# Patient Record
Sex: Male | Born: 1990 | Race: White | Hispanic: No | Marital: Single | Smoking: Current every day smoker
Health system: Southern US, Community
[De-identification: ages and names within clinical notes are randomized; demographics above are authoritative.]

---

## 2015-01-03 ENCOUNTER — Other Ambulatory Visit: Payer: Self-pay | Admitting: Urology

## 2015-01-03 DIAGNOSIS — R310 Gross hematuria: Secondary | ICD-10-CM

## 2015-01-05 ENCOUNTER — Ambulatory Visit
Admission: RE | Admit: 2015-01-05 | Discharge: 2015-01-05 | Disposition: A | Discharge status: Home / Self Care | Payer: No Typology Code available for payment source | Source: Ambulatory Visit | Attending: Urology | Admitting: Urology

## 2015-01-05 DIAGNOSIS — R310 Gross hematuria: Secondary | ICD-10-CM

## 2016-03-03 IMAGING — US US RENAL
1 series · 14 of 25 positions shown · non-contrast
Comparison: None.

CLINICAL DATA: Gross hematuria.

EXAM:
RENAL/URINARY TRACT ULTRASOUND COMPLETE

[Series 1: us renal · 0.24mm/px · 14 of 44 slices shown]
[im 1/44]
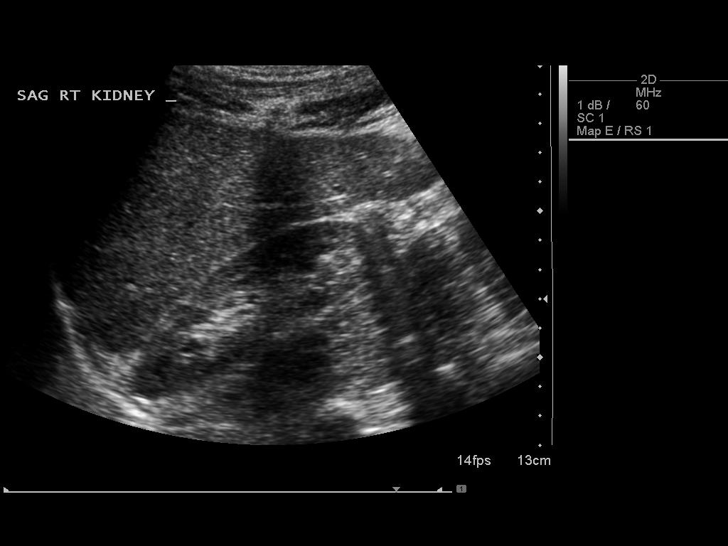
[im 4/44]
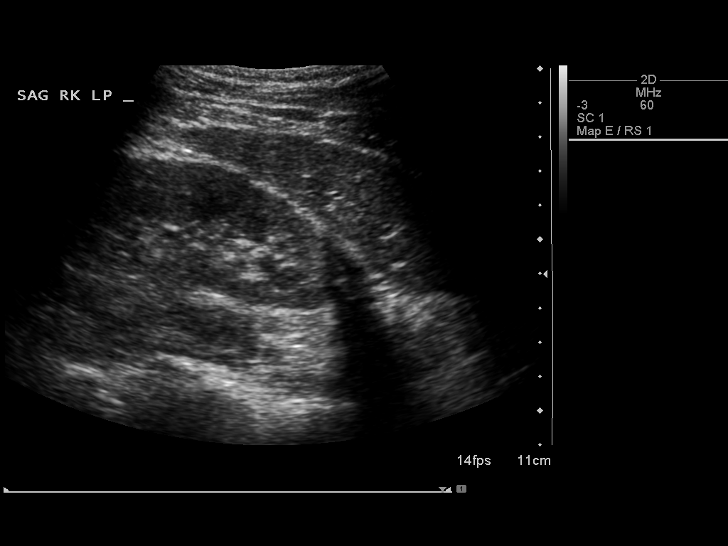
[im 8/44]
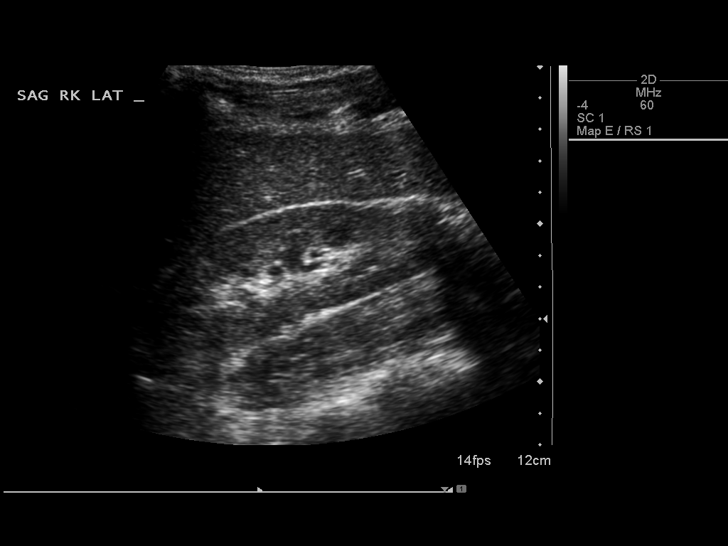
[im 11/44]
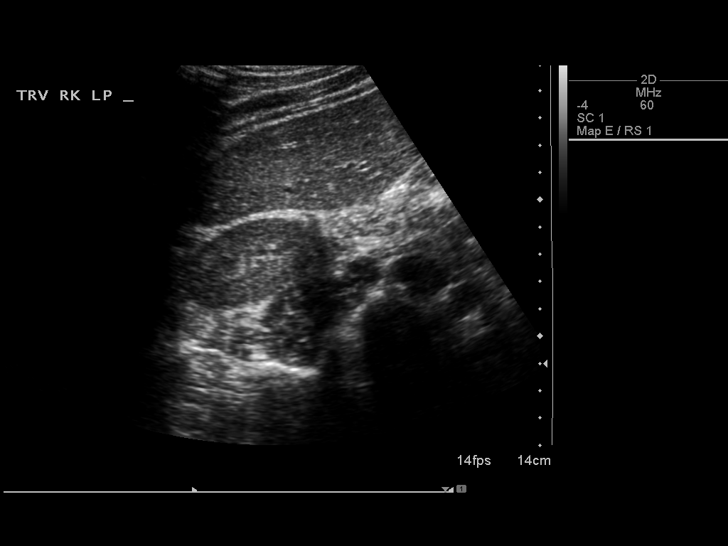
[im 15/44]
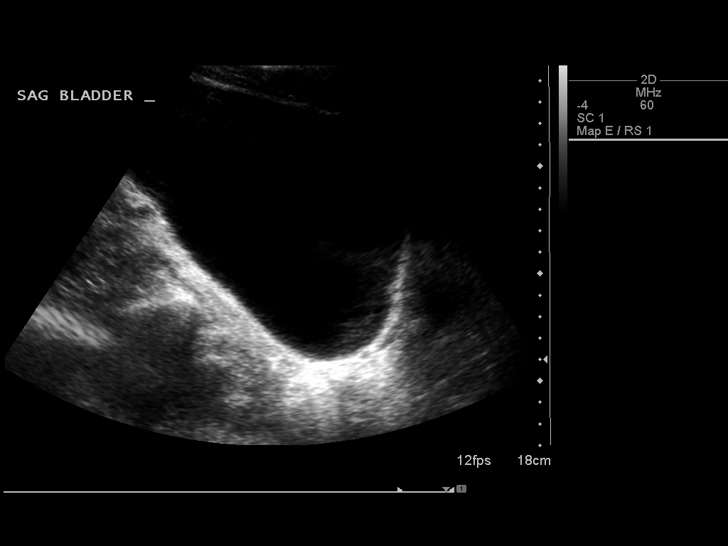
[im 17/44]
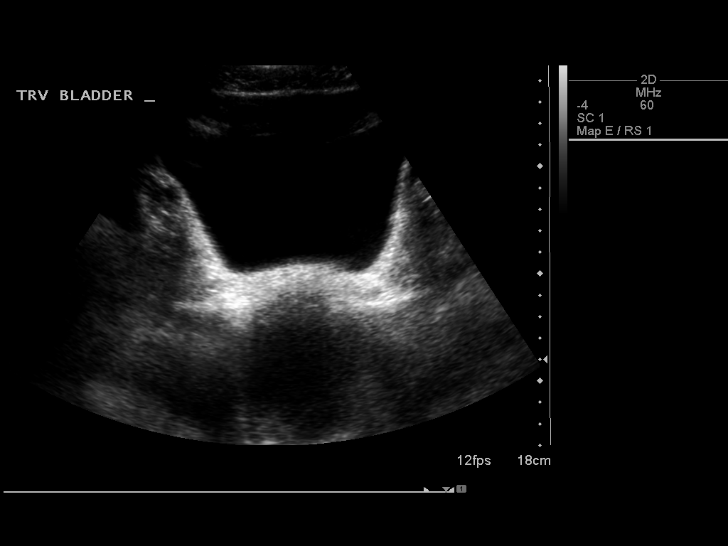
[im 20/44]
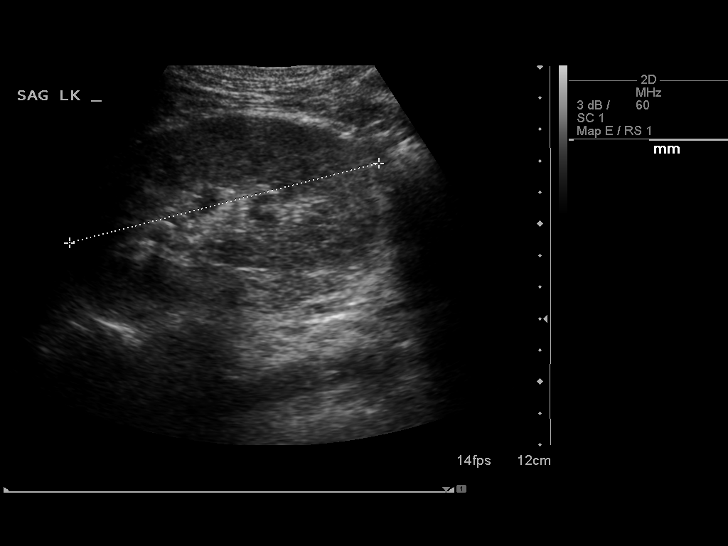
[im 24/44]
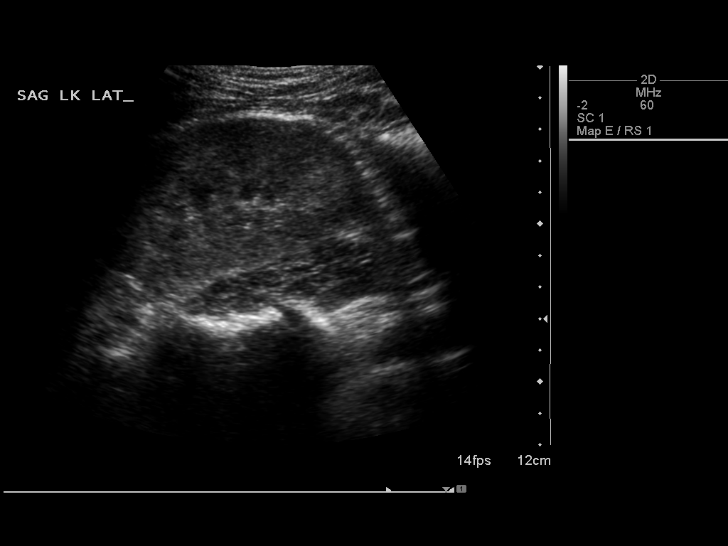
[im 27/44]
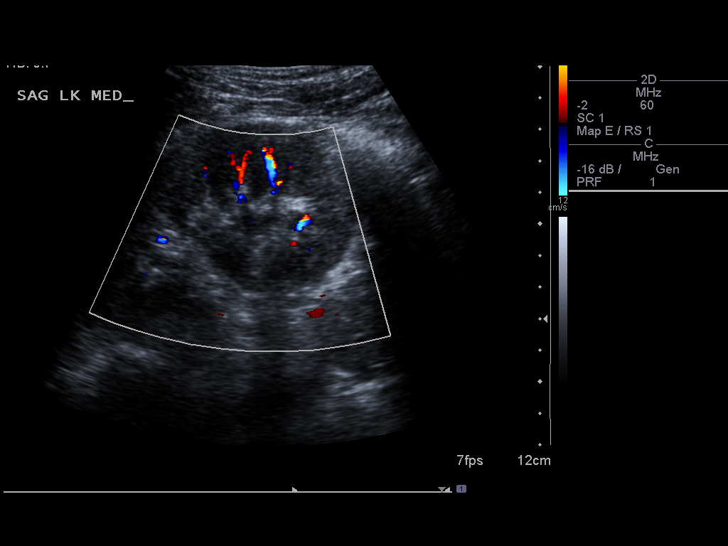
[im 29/44]
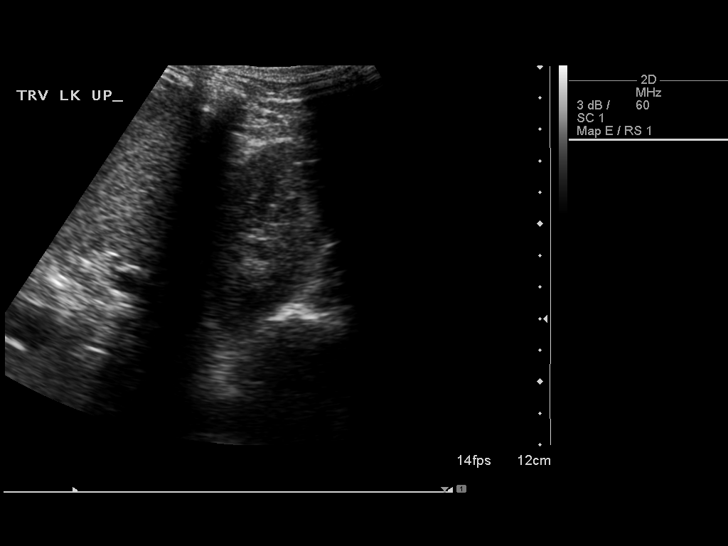
[im 33/44]
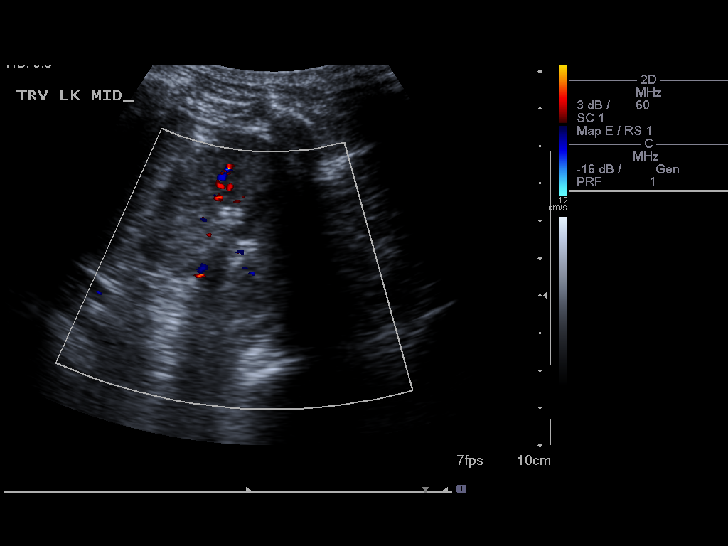
[im 36/44]
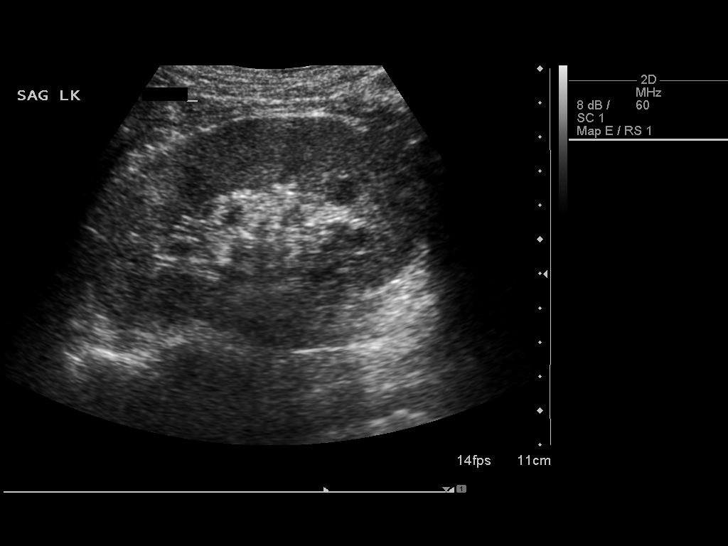
[im 40/44]
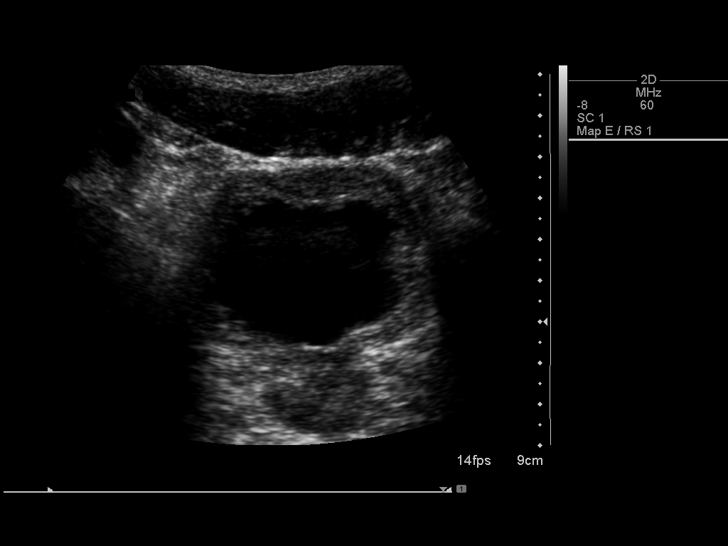
[im 44/44]
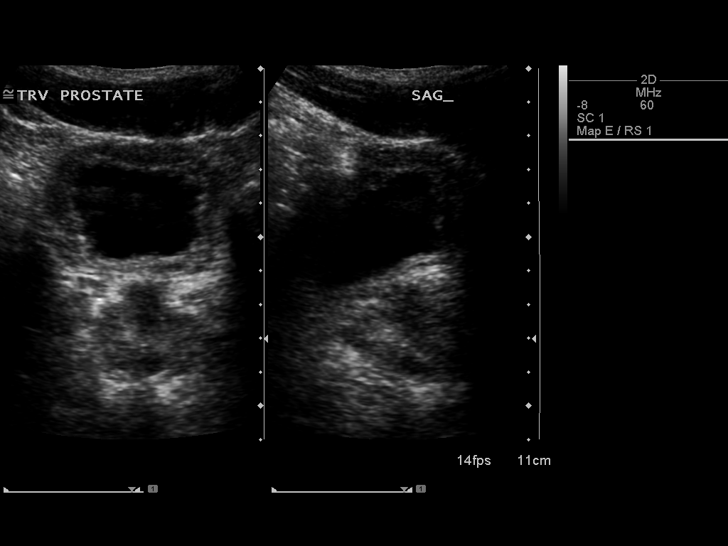

[14 of 25 positions shown; findings below may reference images not displayed]

FINDINGS: Right Kidney:

Length: 10.7 cm. Echogenicity within normal limits. No mass or
hydronephrosis visualized.

Left Kidney:

Length: 10.1 cm. Echogenicity within normal limits. No mass or
hydronephrosis visualized.

Bladder:

Appears normal for degree of bladder distention.
IMPRESSION: Negative.  No evidence of mass or hydronephrosis.

## 2022-01-14 ENCOUNTER — Emergency Department (HOSPITAL_COMMUNITY): Payer: Self-pay

## 2022-01-14 ENCOUNTER — Emergency Department (HOSPITAL_COMMUNITY)
Admission: EM | Admit: 2022-01-14 | Discharge: 2022-01-15 | Disposition: A | Discharge status: Other Acute Inpatient Hospital | Payer: Self-pay | Attending: Emergency Medicine | Admitting: Emergency Medicine

## 2022-01-14 ENCOUNTER — Encounter (HOSPITAL_COMMUNITY): Payer: Self-pay | Admitting: Emergency Medicine

## 2022-01-14 DIAGNOSIS — H7091 Unspecified mastoiditis, right ear: Secondary | ICD-10-CM

## 2022-01-14 DIAGNOSIS — Z20822 Contact with and (suspected) exposure to covid-19: Secondary | ICD-10-CM | POA: Insufficient documentation

## 2022-01-14 DIAGNOSIS — G08 Intracranial and intraspinal phlebitis and thrombophlebitis: Secondary | ICD-10-CM | POA: Insufficient documentation

## 2022-01-14 DIAGNOSIS — R519 Headache, unspecified: Secondary | ICD-10-CM | POA: Insufficient documentation

## 2022-01-14 DIAGNOSIS — H70003 Acute mastoiditis without complications, bilateral: Secondary | ICD-10-CM | POA: Insufficient documentation

## 2022-01-14 LAB — CBC WITH DIFFERENTIAL/PLATELET
Abs Immature Granulocytes: 0.19 10*3/uL — ABNORMAL HIGH (ref 0.00–0.07)
Basophils Absolute: 0.1 10*3/uL (ref 0.0–0.1)
Basophils Relative: 0 %
Eosinophils Absolute: 0 10*3/uL (ref 0.0–0.5)
Eosinophils Relative: 0 %
HCT: 41.1 % (ref 39.0–52.0)
Hemoglobin: 13.8 g/dL (ref 13.0–17.0)
Immature Granulocytes: 1 %
Lymphocytes Relative: 6 %
Lymphs Abs: 1.4 10*3/uL (ref 0.7–4.0)
MCH: 29.5 pg (ref 26.0–34.0)
MCHC: 33.6 g/dL (ref 30.0–36.0)
MCV: 87.8 fL (ref 80.0–100.0)
Monocytes Absolute: 1.7 10*3/uL — ABNORMAL HIGH (ref 0.1–1.0)
Monocytes Relative: 7 %
Neutro Abs: 21.4 10*3/uL — ABNORMAL HIGH (ref 1.7–7.7)
Neutrophils Relative %: 86 %
Platelets: 355 10*3/uL (ref 150–400)
RBC: 4.68 MIL/uL (ref 4.22–5.81)
RDW: 12.9 % (ref 11.5–15.5)
WBC: 24.9 10*3/uL — ABNORMAL HIGH (ref 4.0–10.5)
nRBC: 0 % (ref 0.0–0.2)

## 2022-01-14 LAB — COMPREHENSIVE METABOLIC PANEL
ALT: 14 U/L (ref 0–44)
AST: 13 U/L — ABNORMAL LOW (ref 15–41)
Albumin: 3 g/dL — ABNORMAL LOW (ref 3.5–5.0)
Alkaline Phosphatase: 165 U/L — ABNORMAL HIGH (ref 38–126)
Anion gap: 12 (ref 5–15)
BUN: 11 mg/dL (ref 6–20)
CO2: 23 mmol/L (ref 22–32)
Calcium: 8.7 mg/dL — ABNORMAL LOW (ref 8.9–10.3)
Chloride: 97 mmol/L — ABNORMAL LOW (ref 98–111)
Creatinine, Ser: 0.92 mg/dL (ref 0.61–1.24)
GFR, Estimated: >60 mL/min (ref >60)
Glucose, Bld: 121 mg/dL — ABNORMAL HIGH (ref 70–99)
Potassium: 4.3 mmol/L (ref 3.5–5.1)
Sodium: 132 mmol/L — ABNORMAL LOW (ref 135–145)
Total Bilirubin: 1 mg/dL (ref 0.3–1.2)
Total Protein: 7 g/dL (ref 6.5–8.1)

## 2022-01-14 MED ORDER — IOHEXOL 300 MG/ML  SOLN
80.0000 mL | Freq: Once | INTRAMUSCULAR | Status: AC | PRN
  Administered 2022-01-14: 80 mL via INTRAVENOUS

## 2022-01-14 MED ORDER — HYDROMORPHONE HCL 1 MG/ML IJ SOLN
0.5000 mg | Freq: Once | INTRAMUSCULAR | Status: AC
  Administered 2022-01-14: 0.5 mg via INTRAVENOUS
  Filled 2022-01-14: qty 1

## 2022-01-14 MED ORDER — VANCOMYCIN HCL 1250 MG/250ML IV SOLN
1250.0000 mg | Freq: Once | INTRAVENOUS | Status: AC
  Administered 2022-01-15: 1250 mg via INTRAVENOUS
  Filled 2022-01-14: qty 250

## 2022-01-14 MED ORDER — FENTANYL CITRATE PF 50 MCG/ML IJ SOSY: 50.0000 ug | PREFILLED_SYRINGE | Freq: Once | INTRAMUSCULAR | Status: DC

## 2022-01-14 MED ORDER — LACTATED RINGERS IV BOLUS
1000.0000 mL | Freq: Once | INTRAVENOUS | Status: AC
  Administered 2022-01-14: 1000 mL via INTRAVENOUS

## 2022-01-14 MED ORDER — GADOBUTROL 1 MMOL/ML IV SOLN
6.0000 mL | Freq: Once | INTRAVENOUS | Status: AC | PRN
  Administered 2022-01-14: 6 mL via INTRAVENOUS

## 2022-01-14 MED ORDER — ONDANSETRON HCL 4 MG/2ML IJ SOLN
4.0000 mg | Freq: Once | INTRAMUSCULAR | Status: AC
  Administered 2022-01-14: 4 mg via INTRAVENOUS
  Filled 2022-01-14: qty 2

## 2022-01-14 MED ORDER — SODIUM CHLORIDE 0.9 % IV SOLN
2.0000 g | Freq: Once | INTRAVENOUS | Status: AC
  Administered 2022-01-15: 2 g via INTRAVENOUS
  Filled 2022-01-14: qty 2

## 2022-01-14 NOTE — ED Provider Triage Note (Signed)
Emergency Medicine Provider Triage Evaluation Note  Joshua Cantu , a 31 y.o. male  was evaluated in triage.  Pt complains of headache and neck pain for a week.  Patient initially woke up with right-sided neck pain, and thought that he had slept wrong.  Since then he has had a severe headache, generalized fatigue, cough, and persistent pain to the point that he does not want to move his neck at all.  He is also felt nauseous, but is not sure if this is because he is not eating well.  Review of Systems  Positive: As above Negative: Congestion, vision changes, vomiting, gait changes  Physical Exam  BP 135/78 (BP Location: Right Arm)    Pulse (!) 122    Temp 100 F (37.8 C) (Oral)    Resp 17    SpO2 98%  Gen:   Awake, no distress   Resp:  Normal effort  MSK:   Moves extremities without difficulty  Other:  Increased erythema of the right lateral neck, with tenderness to palpation across the sternocleidomastoid and tender cervical adenopathy on the right.  Attempted to gauge passive ROM of neck, but patient states that it hurts too badly to move and he does not want to.  No midline spinal tenderness.  Medical Decision Making  Medically screening exam initiated at 11:35 AM.  Appropriate orders placed.  Joshua Cantu was informed that the remainder of the evaluation will be completed by another provider, this initial triage assessment does not replace that evaluation, and the importance of remaining in the ED until their evaluation is complete.  Due to concerning prodromal symptoms with related headache and neck pain/stiffness, with a temperature of 100 F in triage, will obtain broader lab work with concern for possible meningitis    Yahira Timberman T, PA-C 01/14/22 1150

## 2022-01-14 NOTE — ED Notes (Signed)
Pt currently at MRI, father at bedside in room.

## 2022-01-14 NOTE — ED Provider Notes (Signed)
Contacted by radiologist with findings of CT scan.  Reports that patient has thrombosis of Right internal jugular vein, that extends intracranially.  Radiologist recommends MRI with and without contrast for further evaluation.  Also reports that patient has edema to the right side of his neck which may be infectious in nature.   Dyann Ruddle 01/14/22 Edison Simon, MD 01/14/22 [phone]

## 2022-01-14 NOTE — ED Triage Notes (Signed)
Pt. Stated, I woke up a week ago and Ive not been able to move my neck . I also have a knot undrr my chin , its been there 2-3 days.

## 2022-01-14 NOTE — ED Notes (Signed)
Patient transported to MRI 

## 2022-01-14 NOTE — ED Provider Notes (Addendum)
[facility] EMERGENCY DEPARTMENT Provider Note   CSN: [id number] Arrival date & time: 01/14/22  1119     History  Chief Complaint  Patient presents with   Torticollis   Headache    Joshua Cantu is a 31 y.o. male.   Headache Associated symptoms: hearing loss and neck pain   Associated symptoms: no ear pain      Patient presents with right-sided neck pain and headache.  Difficulty turning neck.  Began around a week ago.  States that just woke up and thought he had a crick in his neck.  Pain is gotten worse.  Worse with turning his head.  No numbness or weakness.  Has a dull headache.  States he has had some difficulty hearing out of his right ear for a while.  No dental pain.  No difficulty swallowing.  Found to have a fever here.  No past medical history on file.  Home Medications Prior to Admission medications   Medication Sig Start Date End Date Taking? Authorizing Provider  naproxen sodium (ALEVE) 220 MG tablet Take 220-440 mg by mouth 2 (two) times daily as needed (headache).   Yes [provider]      Allergies    Patient has no known allergies.    Review of Systems   Review of Systems  Constitutional:  Negative for appetite change.  HENT:  Positive for hearing loss. Negative for ear pain.   Respiratory:  Negative for shortness of breath.   Cardiovascular:  Negative for chest pain.  Musculoskeletal:  Positive for neck pain.  Skin:  Negative for rash.  Neurological:  Positive for headaches.  Psychiatric/Behavioral:  Negative for confusion.    Physical Exam Updated Vital Signs BP 130/87    Pulse 99    Temp (!) 100.4 F (38 C) (Oral)    Resp (!) 30    SpO2 96%  Physical Exam Vitals reviewed.  HENT:     Head: Normocephalic and atraumatic.     Comments: Both TMs somewhat white but more erythema around right. Eyes:     Pupils: Pupils are equal, round, and reactive to light.  Neck:     Comments: Erythema to right side of neck with  tenderness.  No induration of skin. Cardiovascular:     Rate and Rhythm: Regular rhythm.  Pulmonary:     Breath sounds: No wheezing or rhonchi.  Chest:     Chest wall: No tenderness.  Abdominal:     Tenderness: There is no abdominal tenderness.  Musculoskeletal:     Comments: Decreased range of motion of neck due to right neck pain.  Skin:    General: Skin is warm.  Neurological:     Mental Status: He is alert and oriented to person, place, and time.  Psychiatric:        Mood and Affect: Mood normal.    ED Results / Procedures / Treatments   Labs (all labs ordered are listed, but only abnormal results are displayed) Labs Reviewed  CBC WITH DIFFERENTIAL/PLATELET - Abnormal; Notable for the following components:      Result Value   WBC 24.9 (*)    Neutro Abs 21.4 (*)    Monocytes Absolute 1.7 (*)    Abs Immature Granulocytes 0.19 (*)    All other components within normal limits  COMPREHENSIVE METABOLIC PANEL - Abnormal; Notable for the following components:   Sodium 132 (*)    Chloride 97 (*)    Glucose, Bld 121 (*)  Calcium 8.7 (*)    Albumin 3.0 (*)    AST 13 (*)    Alkaline Phosphatase 165 (*)    All other components within normal limits  RESP PANEL BY RT-PCR (FLU A&B, COVID) ARPGX2  CULTURE, BLOOD (ROUTINE X 2)  CULTURE, BLOOD (ROUTINE X 2)  LACTIC ACID, PLASMA  LACTIC ACID, PLASMA    EKG None  Radiology CT Head Wo Contrast  Result Date: 01/14/2022 CLINICAL DATA:  Unable to move neck. Meningitis/CNS infection suspected. EXAM: CT HEAD WITHOUT CONTRAST TECHNIQUE: Contiguous axial images were obtained from the base of the skull through the vertex without intravenous contrast. RADIATION DOSE REDUCTION: This exam was performed according to the departmental dose-optimization program which includes automated exposure control, adjustment of the mA and/or kV according to patient size and/or use of iterative reconstruction technique. COMPARISON:  None. FINDINGS: Brain: No  evidence of acute infarction, hemorrhage, hydrocephalus, extra-axial collection or mass lesion/mass effect. Vascular: No hyperdense vessel or unexpected calcification. Skull: Calvarium appears intact. Sinuses/Orbits: Paranasal sinuses are clear. Diffuse opacification of the right mastoid air cells with partial opacification of the left mastoid air cells. Other: None. IMPRESSION: 1. No acute intracranial abnormalities. Note that if meningitis is suspected, MRI would be more sensitive for evaluation. 2. Bilateral mastoid effusions, greater on the right. Electronically Signed   By: Burman Nieves M.D.   On: 01/14/2022 17:49   CT Soft Tissue Neck W Contrast  Result Date: 01/14/2022 CLINICAL DATA:  soft tissue swelling EXAM: CT NECK WITH CONTRAST TECHNIQUE: Multidetector CT imaging of the neck was performed using the standard protocol following the bolus administration of intravenous contrast. RADIATION DOSE REDUCTION: This exam was performed according to the departmental dose-optimization program which includes automated exposure control, adjustment of the mA and/or kV according to patient size and/or use of iterative reconstruction technique. CONTRAST:  79mL OMNIPAQUE IOHEXOL 300 MG/ML  SOLN COMPARISON:  None. FINDINGS: Pharynx and larynx: Evaluation is limited by streak artifact from dental amalgam. No definite evidence of mass or edema in the oropharynx. Salivary glands: No inflammation, mass, or stone. Thyroid: Normal. Lymph nodes: Mildly asymmetric right upper cervical chain nodes. Vascular: Thrombosis of the right internal jugular vein as well as a more superficial draining vein in the right neck. Major arteries appear are grossly patent in the neck. Limited intracranial: Limited assessment/visualization without definite acute intraparenchymal abnormality. Visualized orbits: Negative. Mastoids and visualized paranasal sinuses: Clear. Skeleton: No evidence of acute abnormality. Upper chest: Visualized lung  apices are clear. Other: Mild edema in the surrounding fat along the thrombosed veins in the right neck. No discrete, drainable fluid collection identified. IMPRESSION: 1. Thrombosis of the right internal jugular vein throughout the right neck as well as a more superficial draining vein in the anterior right neck. Non opacification of the right jugular bulb, sigmoid sinus and distal right transverse sinus is concerning for intracranial extension of clot. MRI brain with and without contrast may be helpful to further evaluate for associated intracranial complication. 2. Mild edema in the surrounding fat along the thrombosed veins in the right neck, which could be inflammatory or infectious. Also, mildly asymmetric right upper cervical chain nodes, probably reactive. Findings discussed with provider Badalamente via telephone at 6:05 p.m. Electronically Signed   By: Feliberto Harts M.D.   On: 01/14/2022 18:12   MR BRAIN W WO CONTRAST  Result Date: 01/14/2022 CLINICAL DATA:  Initial evaluation for thrombosis. EXAM: MRI HEAD WITHOUT AND WITH CONTRAST TECHNIQUE: Multiplanar, multiecho pulse sequences of the brain  and surrounding structures were obtained without and with intravenous contrast. CONTRAST:  6mL GADAVIST GADOBUTROL 1 MMOL/ML IV SOLN COMPARISON:  Prior CT from earlier the same day. FINDINGS: Brain: Cerebral volume within normal limits for age. No focal parenchymal signal abnormality. No abnormal foci of restricted diffusion to suggest acute or subacute ischemia. Gray-white matter differentiation maintained. No encephalomalacia to suggest prior infarction or other insult. No acute or chronic intracranial hemorrhage within the brain parenchyma itself. No mass lesion, midline shift or mass effect. No hydrocephalus or extra-axial fluid collection. Pituitary gland suprasellar region normal. Midline structures intact. Asymmetric signal intensity with filling defect seen involving the right transverse and sigmoid  sinuses, extending into the proximal right internal jugular vein, compatible with dural sinus thrombosis. There is a prominent right mastoid effusion, raising the possibility for acute otomastoiditis with associated septic thrombophlebitis. Associated mild dural enhancement about the partially thrombosed right transverse and sigmoid sinuses (series 13, image 10). While this may be reactive in nature, possible early CNS spread of infection is difficult to exclude. No other MRI evidence for meningitis or infection elsewhere within the brain. No other abnormal enhancement. Vascular: Partially occlusive dural sinus thrombosis involving the right transverse and sigmoid sinuses as well as the right internal jugular vein as above. Normal flow voids seen elsewhere within the major dural sinuses. Major intracranial arterial vascular flow voids maintained. Hypoplastic left vertebral artery noted. Skull and upper cervical spine: Craniocervical junction within normal limits. Bone marrow signal intensity within normal limits. No focal marrow replacing lesion. No scalp soft tissue abnormality. Sinuses/Orbits: Globes and orbital soft tissues within normal limits. Paranasal sinuses are largely clear. Large right with small left bilateral mastoid effusions noted. Other: None. IMPRESSION: 1. 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 2. Large right mastoid effusion, raising the possibility for acute otomastoiditis with associated septic thrombophlebitis as the underlying etiology for the acute dural thrombosis. 3. Associated dural enhancement about the partially thrombosed right transverse and sigmoid sinuses. While this may be reactive in nature, possible early CNS spread of infection is difficult to exclude. Correlation with dedicated LP and CSF analysis recommended as clinically warranted. 4. Otherwise normal brain MRI. Electronically  Signed   By: Rise MuBenjamin  McClintock M.D.   On: 01/14/2022 22:57   DG Chest Portable 1 View  Result Date: 01/14/2022 CLINICAL DATA:  Fever EXAM: PORTABLE CHEST 1 VIEW COMPARISON:  None. FINDINGS: The heart size and mediastinal contours are within normal limits. Both lungs are clear. The visualized skeletal structures are unremarkable. IMPRESSION: No active disease. Electronically Signed   By: Jasmine PangKim  Fujinaga M.D.   On: 01/14/2022 22:32    Procedures Procedures    Medications Ordered in ED Medications  ceFEPIme (MAXIPIME) 2 g in sodium chloride 0.9 % 100 mL IVPB (has no administration in time range)  vancomycin (VANCOREADY) IVPB 1250 mg/250 mL (has no administration in time range)  iohexol (OMNIPAQUE) 300 MG/ML solution 80 mL (80 mLs Intravenous Contrast Given 01/14/22 1742)  gadobutrol (GADAVIST) 1 MMOL/ML injection 6 mL (6 mLs Intravenous Contrast Given 01/14/22 2139)  ondansetron (ZOFRAN) injection 4 mg (4 mg Intravenous Given 01/14/22 2256)  HYDROmorphone (DILAUDID) injection 0.5 mg (0.5 mg Intravenous Given 01/14/22 2256)  lactated ringers bolus 1,000 mL (1,000 mLs Intravenous New Bag/Given 01/14/22 2259)    ED Course/ Medical Decision Making/ A&P  Medical Decision Making Problems Addressed: Intracranial septic thrombophlebitis: acute illness or injury that poses a threat to life or bodily functions Mastoiditis of right side: acute illness or injury that poses a threat to life or bodily functions  Amount and/or Complexity of Data Reviewed Labs: ordered. Decision-making details documented in ED Course. Radiology: ordered and independent interpretation performed. Decision-making details documented in ED Course.  Risk Prescription drug management.  Initial differential diagnosis for neck pain is long and includes life-threatening conditions such as deep space infections and more benign problems such as muscle strain. Patient with neck pain.  Has had for around a week  now.  Has had some decreased hearing in his right ear.  Pain moving the neck.  States it is worse at.  Does have a dull headache.  Has a fever here but did not know he had it.  White count is elevated 24.  CT of the neck is been done and showed likely jugular venous thrombosis with intracranial extension.  MRI done and showed intracranial extension with a dural venous sinus thrombosis and mastoiditis.  Discussed with Dr. Amador Cunas from neurosurgery.  Recommends neurology consult since this is not surgical.  We will also discuss with ENT for consult.  Will admit to internal medicine.  Empiric antibiotics of been started.  Blood cultures been done.   Discussed with Dr. Elijah Birk from ENT.  States potentially the patient would need mastoidectomy and he does not do that.  May require transfer to another center. Discussed with Dr. Otelia Limes from neurology.  Recommended antibiotics and heparinization.  From a neurologic standpoint he can handle the patient if does not need transfer.  Discussed with Dr. Chestine Spore from ENT at [facility]  Will power share CT and MRI.  She will evaluate on need for transfer.   Discussed with Dr. Chestine Spore again.  States that should be an ER to ER transfer.  Dr. Ty Hilts is accepting  CRITICAL CARE Performed by: Benjiman Core Total critical care time: 30 minutes Critical care time was exclusive of separately billable procedures and treating other patients. Critical care was necessary to treat or prevent imminent or life-threatening deterioration. Critical care was time spent personally by me on the following activities: development of treatment plan with patient and/or surrogate as well as nursing, discussions with consultants, evaluation of patient's response to treatment, examination of patient, obtaining history from patient or surrogate, ordering and performing treatments and interventions, ordering and review of laboratory studies, ordering and review of radiographic studies, pulse  oximetry and re-evaluation of patient's condition.          Final Clinical Impression(s) / ED Diagnoses Final diagnoses:  Intracranial septic thrombophlebitis  Mastoiditis of right side    Rx / DC Orders ED Discharge Orders     None         Benjiman Core, MD 01/14/22 2354    Benjiman Core, MD 01/15/22 Ivor Reining    Benjiman Core, MD 01/15/22 0003

## 2022-01-15 LAB — RESP PANEL BY RT-PCR (FLU A&B, COVID) ARPGX2
Influenza A by PCR: NEGATIVE
Influenza B by PCR: NEGATIVE
SARS Coronavirus 2 by RT PCR: NEGATIVE

## 2022-01-15 LAB — LACTIC ACID, PLASMA: Lactic Acid, Venous: 1.1 mmol/L (ref 0.5–1.9)

## 2022-01-15 MED ORDER — HEPARIN (PORCINE) 25000 UT/250ML-% IV SOLN
900.0000 [IU]/h | INTRAVENOUS | Status: DC
  Administered 2022-01-15: 900 [IU]/h via INTRAVENOUS
  Filled 2022-01-15: qty 250

## 2022-01-15 MED ORDER — HEPARIN BOLUS VIA INFUSION
3000.0000 [IU] | Freq: Once | INTRAVENOUS | Status: AC
  Administered 2022-01-15: 3000 [IU] via INTRAVENOUS
  Filled 2022-01-15: qty 3000

## 2022-01-15 NOTE — Progress Notes (Signed)
ANTICOAGULATION CONSULT NOTE - Follow Up Consult  Pharmacy Consult for Heparin Indication: septic dural sinus thrombosis  No Known Allergies  Patient Measurements: Height: 5\' 9"  (175.3 cm) Weight: 57.6 kg (127 lb) IBW/kg (Calculated) : 70.7   Vital Signs: Temp: 100.4 F (38 C) (02/07 2222) Temp Source: Oral (02/07 2222) BP: 130/87 (02/07 2315) Pulse Rate: 99 (02/07 2315)  Labs: Recent Labs    01/14/22 1152  HGB 13.8  HCT 41.1  PLT 355  CREATININE 0.92    Estimated Creatinine Clearance: 97.4 mL/min (by C-G formula based on SCr of 0.92 mg/dL).   Assessment: 31 year old male to begin heparin for thrombosis  Goal of Therapy:  Heparin level 0.3-0.7 units/ml Monitor platelets by anticoagulation protocol: Yes   Plan:  Heparin 3000 units iv bolus x 1 Heparin drip at 900 units / hr Heparin level and CBC in 6 hours Daily heparin level, CBC  Thank you 26, PharmD  01/15/2022,12:10 AM

## 2022-01-15 NOTE — ED Notes (Signed)
Patient verbalizes understanding of transfer to [place] for questions and answers were provided. Pt transferred via [facility]. Report given to [facility] charge nurse.

## 2022-01-16 LAB — BLOOD CULTURE ID PANEL (REFLEXED) - BCID2

## 2022-01-18 LAB — CULTURE, BLOOD (ROUTINE X 2): Special Requests: ADEQUATE

## 2022-01-19 ENCOUNTER — Telehealth (HOSPITAL_BASED_OUTPATIENT_CLINIC_OR_DEPARTMENT_OTHER): Payer: Self-pay | Admitting: *Deleted

## 2022-01-19 LAB — CULTURE, BLOOD (ROUTINE X 2): Special Requests: ADEQUATE

## 2022-01-19 NOTE — Progress Notes (Signed)
ED Antimicrobial Stewardship Positive Culture Follow Up   Joshua Cantu is an 31 y.o. male who presented to [facility] on 01/14/2022 with a chief complaint of  Chief Complaint  Patient presents with   Torticollis   Headache    Recent Results (from the past 720 hour(s))  Culture, blood (routine x 2)     Status: Abnormal   Collection Time: 01/14/22 11:05 PM   Specimen: BLOOD  Result Value Ref Range Status   Specimen Description BLOOD RIGHT FOREARM  Final   Special Requests   Final    BOTTLES DRAWN AEROBIC AND ANAEROBIC Blood Culture adequate volume   Culture  Setup Time   Final    GRAM POSITIVE COCCI ANAEROBIC BOTTLE ONLY CRITICAL RESULT CALLED TO, READ BACK BY AND VERIFIED WITH: RN KELLY NEAL 01/16/22@5 :62 BY TW    Culture (A)  Final    STREPTOCOCCUS INTERMEDIUS SUSCEPTIBILITIES PERFORMED ON PREVIOUS CULTURE WITHIN THE LAST 5 DAYS. Performed at [facility], [place] [address] [place], [place] [address]    Report Status 01/18/2022 FINAL  Final  Blood Culture ID Panel (Reflexed)     Status: Abnormal   Collection Time: 01/14/22 11:05 PM  Result Value Ref Range Status   Enterococcus faecalis NOT DETECTED NOT DETECTED Final   Enterococcus Faecium NOT DETECTED NOT DETECTED Final   Listeria monocytogenes NOT DETECTED NOT DETECTED Final   Staphylococcus species NOT DETECTED NOT DETECTED Final   Staphylococcus aureus (BCID) NOT DETECTED NOT DETECTED Final   Staphylococcus epidermidis NOT DETECTED NOT DETECTED Final   Staphylococcus lugdunensis NOT DETECTED NOT DETECTED Final   Streptococcus species DETECTED (A) NOT DETECTED Final    Comment: Not Enterococcus species, Streptococcus agalactiae, Streptococcus pyogenes, or Streptococcus pneumoniae. CRITICAL RESULT CALLED TO, READ BACK BY AND VERIFIED WITH: RN KELLY NEAL 01/16/22@5 :45 BY TW    Streptococcus agalactiae NOT DETECTED NOT DETECTED Final   Streptococcus pneumoniae NOT DETECTED NOT DETECTED Final   Streptococcus pyogenes NOT  DETECTED NOT DETECTED Final   A.calcoaceticus-baumannii NOT DETECTED NOT DETECTED Final   Bacteroides fragilis NOT DETECTED NOT DETECTED Final   Enterobacterales NOT DETECTED NOT DETECTED Final   Enterobacter cloacae complex NOT DETECTED NOT DETECTED Final   Escherichia coli NOT DETECTED NOT DETECTED Final   Klebsiella aerogenes NOT DETECTED NOT DETECTED Final   Klebsiella oxytoca NOT DETECTED NOT DETECTED Final   Klebsiella pneumoniae NOT DETECTED NOT DETECTED Final   Proteus species NOT DETECTED NOT DETECTED Final   Salmonella species NOT DETECTED NOT DETECTED Final   Serratia marcescens NOT DETECTED NOT DETECTED Final   Haemophilus influenzae NOT DETECTED NOT DETECTED Final   Neisseria meningitidis NOT DETECTED NOT DETECTED Final   Pseudomonas aeruginosa NOT DETECTED NOT DETECTED Final   Stenotrophomonas maltophilia NOT DETECTED NOT DETECTED Final   Candida albicans NOT DETECTED NOT DETECTED Final   Candida auris NOT DETECTED NOT DETECTED Final   Candida glabrata NOT DETECTED NOT DETECTED Final   Candida krusei NOT DETECTED NOT DETECTED Final   Candida parapsilosis NOT DETECTED NOT DETECTED Final   Candida tropicalis NOT DETECTED NOT DETECTED Final   Cryptococcus neoformans/gattii NOT DETECTED NOT DETECTED Final    Comment: Performed at [facility], [address], [place], [place] [address]  Culture, blood (routine x 2)     Status: Abnormal   Collection Time: 01/14/22 11:17 PM   Specimen: BLOOD  Result Value Ref Range Status   Specimen Description BLOOD LEFT FOREARM  Final   Special Requests  Final    BOTTLES DRAWN AEROBIC AND ANAEROBIC Blood Culture adequate volume   Culture  Setup Time   Final    GRAM POSITIVE COCCI ANAEROBIC BOTTLE ONLY CRITICAL VALUE NOTED.  VALUE IS CONSISTENT WITH PREVIOUSLY REPORTED AND CALLED VALUE. Performed at [facility], [place] [address], [place], [place] [address]    Culture STREPTOCOCCUS INTERMEDIUS (A)  Final   Report Status  01/18/2022 FINAL  Final   Organism ID, Bacteria STREPTOCOCCUS INTERMEDIUS  Final      Susceptibility   Streptococcus intermedius - MIC*    PENICILLIN <=0.06 SENSITIVE Sensitive     CEFTRIAXONE <=0.12 SENSITIVE Sensitive     ERYTHROMYCIN <=0.12 SENSITIVE Sensitive     LEVOFLOXACIN <=0.25 SENSITIVE Sensitive     VANCOMYCIN 0.25 SENSITIVE Sensitive     * STREPTOCOCCUS INTERMEDIUS  Resp Panel by RT-PCR (Flu A&B, Covid) Nasopharyngeal Swab     Status: None   Collection Time: 01/14/22 11:18 PM   Specimen: Nasopharyngeal Swab; Nasopharyngeal(NP) swabs in vial transport medium  Result Value Ref Range Status   SARS Coronavirus 2 by RT PCR NEGATIVE NEGATIVE Final    Comment: (NOTE) SARS-CoV-2 target nucleic acids are NOT DETECTED.  The SARS-CoV-2 RNA is generally detectable in upper respiratory specimens during the acute phase of infection. The lowest concentration of SARS-CoV-2 viral copies this assay can detect is 138 copies/mL. A negative result does not preclude SARS-Cov-2 infection and should not be used as the sole basis for treatment or other patient management decisions. A negative result may occur with  improper specimen collection/handling, submission of specimen other than nasopharyngeal swab, presence of viral mutation(s) within the areas targeted by this assay, and inadequate number of viral copies(<138 copies/mL). A negative result must be combined with clinical observations, patient history, and epidemiological information. The expected result is Negative.  Fact Sheet for Patients:  EntrepreneurPulse.com.au  Fact Sheet for Healthcare Providers:  IncredibleEmployment.be  This test is no t yet approved or cleared by the [place] FDA and  has been authorized for detection and/or diagnosis of SARS-CoV-2 by FDA under an Emergency Use Authorization (EUA). This EUA will remain  in effect (meaning this test can be used) for the duration of  the COVID-19 declaration under Section 564(b)(1) of the Act, 21 U.S.C.section 360bbb-3(b)(1), unless the authorization is terminated  or revoked sooner.       Influenza A by PCR NEGATIVE NEGATIVE Final   Influenza B by PCR NEGATIVE NEGATIVE Final    Comment: (NOTE) The Xpert Xpress SARS-CoV-2/FLU/RSV plus assay is intended as an aid in the diagnosis of influenza from Nasopharyngeal swab specimens and should not be used as a sole basis for treatment. Nasal washings and aspirates are unacceptable for Xpert Xpress SARS-CoV-2/FLU/RSV testing.  Fact Sheet for Patients: EntrepreneurPulse.com.au  Fact Sheet for Healthcare Providers: IncredibleEmployment.be  This test is not yet approved or cleared by the [place] FDA and has been authorized for detection and/or diagnosis of SARS-CoV-2 by FDA under an Emergency Use Authorization (EUA). This EUA will remain in effect (meaning this test can be used) for the duration of the COVID-19 declaration under Section 564(b)(1) of the Act, 21 U.S.C. section 360bbb-3(b)(1), unless the authorization is terminated or revoked.  Performed at [facility], [place] [address] [place], [place] [address]     [x]  Patient admitted to [facility]. Spoke with pt's RN Onalee Hua and informed of results. Patient is on appropriate abx there. Faxed results to [phone]. No further action is needed.  Lorelei Pont, PharmD, BCPS 01/19/2022 10:29 AM ED Clinical Pharmacist -  [phone]

## 2022-01-19 NOTE — Telephone Encounter (Signed)
Post ED Visit - Positive Culture Follow-up  Culture report reviewed by antimicrobial stewardship pharmacist: [facility] []  , Pharm.D. []  Enzo Bi, Pharm.D., BCPS AQ-ID []  , Pharm.D., BCPS []  Celedonio Miyamoto, Pharm.D., BCPS []  [place], Garvin Fila.D., BCPS, AAHIVP []  , Pharm.D., BCPS, AAHIVP []  Georgina Pillion, PharmD, BCPS []  , PharmD, BCPS []  [place], PharmD, BCPS []  [place], PharmD []  , PharmD, BCPS [x]  Estella Husk, PharmD  Pharmacy Team []  Lysle Pearl, PharmD []  , PharmD []  Phillips Climes, PharmD []  , Rph []  Agapito Games) , PharmD []  Verlan Friends, PharmD []  , PharmD []  Mervyn Gay, PharmD []  , PharmD []  Clayton Lefort, PharmD []  [facility], PharmD []  , PharmD []  Len Childs, PharmD   Positive Blood culture Treated with Rocephin/Glagyl, and no further patient follow-up is required at this time.  01/19/2022, 2:25 PM
